# Patient Record
Sex: Female | Born: 1990 | Race: Black or African American | Hispanic: No | Marital: Single | Smoking: Never smoker
Health system: Southern US, Community
[De-identification: ages and names within clinical notes are randomized; demographics above are authoritative.]

---

## 2019-04-15 ENCOUNTER — Encounter: Payer: Self-pay | Admitting: *Deleted

## 2019-04-15 ENCOUNTER — Other Ambulatory Visit: Payer: Self-pay | Admitting: *Deleted

## 2019-04-15 DIAGNOSIS — Z20822 Contact with and (suspected) exposure to covid-19: Secondary | ICD-10-CM

## 2019-04-16 LAB — NOVEL CORONAVIRUS, NAA: SARS-CoV-2, NAA: NOT DETECTED

## 2020-05-08 ENCOUNTER — Other Ambulatory Visit: Payer: Self-pay

## 2020-05-08 ENCOUNTER — Emergency Department (HOSPITAL_COMMUNITY)
Admission: EM | Admit: 2020-05-08 | Discharge: 2020-05-08 | Disposition: A | Discharge status: Home / Self Care | Payer: Medicaid Other | Attending: Emergency Medicine | Admitting: Emergency Medicine

## 2020-05-08 ENCOUNTER — Encounter (HOSPITAL_COMMUNITY): Payer: Self-pay | Admitting: Emergency Medicine

## 2020-05-08 DIAGNOSIS — K047 Periapical abscess without sinus: Secondary | ICD-10-CM | POA: Diagnosis not present

## 2020-05-08 DIAGNOSIS — K0889 Other specified disorders of teeth and supporting structures: Secondary | ICD-10-CM | POA: Diagnosis present

## 2020-05-08 MED ORDER — PENICILLIN V POTASSIUM 500 MG PO TABS: 500.0000 mg | ORAL_TABLET | Freq: Four times a day (QID) | ORAL | 0 refills | Status: AC

## 2020-05-08 NOTE — ED Triage Notes (Signed)
Pt reports L upper dental abscess that popped this morning with drainage.  Denies pain.

## 2020-05-08 NOTE — ED Provider Notes (Signed)
[facility] EMERGENCY DEPARTMENT Provider Note   CSN: [id number] Arrival date & time: 05/08/20  1641     History Chief Complaint  Patient presents with  . dental abscess    Amber Sheppard is a 30 y.o. female presenting for evaluation of dental pain and swelling.   Patient states 2 years ago she cracked her left upper tooth.  She has had intermittent pain, usually controlled with Tylenol.  Last night she developed swelling in the area, she pressed on it, she had purulent drainage.  No drainage today.  No significant worsening pain.  Has not taken anything for pain.  No fevers or chills.  She does not currently have a dentist.  She has no other medical problems, takes no medications daily.  HPI     History reviewed. No pertinent past medical history.  There are no problems to display for this patient.   History reviewed. No pertinent surgical history.   OB History   No obstetric history on file.     No family history on file.  Social History   Tobacco Use  . Smoking status: Never Smoker  . Smokeless tobacco: Never Used  Substance Use Topics  . Alcohol use: Not Currently  . Drug use: Not Currently    Home Medications Prior to Admission medications   Medication Sig Start Date End Date Taking? Authorizing Provider  penicillin v potassium (VEETID) 500 MG tablet Take 1 tablet (500 mg total) by mouth 4 (four) times daily for 7 days. 05/08/20 05/15/20 Yes Milagros Loll, MD    Allergies    Patient has no allergy information on record.  Review of Systems   Review of Systems  Constitutional: Negative for fever.  HENT: Positive for dental problem.     Physical Exam Updated Vital Signs BP (!) 112/55 (BP Location: Right Arm)   Pulse 78   Temp 98.8 F (37.1 C) (Oral)   Resp 18   LMP 04/12/2020   SpO2 98%   Physical Exam Vitals and nursing note reviewed.  Constitutional:      General: She is not in acute distress.    Appearance: She is  well-developed and well-nourished.  HENT:     Head: Normocephalic and atraumatic.     Mouth/Throat:     Dentition: Abnormal dentition. Dental tenderness, gingival swelling and dental caries present.      Comments: Tooth cracked/ground down to gum. ttp of the gum. No purulent drainage. Surrounding gum erythema and edema.  Eyes:     Extraocular Movements: EOM normal.  Cardiovascular:     Rate and Rhythm: Normal rate and regular rhythm.     Pulses: Normal pulses.  Pulmonary:     Effort: Pulmonary effort is normal.     Breath sounds: Normal breath sounds.  Abdominal:     General: There is no distension.  Musculoskeletal:        General: Normal range of motion.     Cervical back: Normal range of motion.  Skin:    General: Skin is warm.     Findings: No rash.  Neurological:     Mental Status: She is alert and oriented to person, place, and time.  Psychiatric:        Mood and Affect: Mood and affect normal.     ED Results / Procedures / Treatments   Labs (all labs ordered are listed, but only abnormal results are displayed) Labs Reviewed - No data to display  EKG None  Radiology  No results found.  Procedures Procedures   Medications Ordered in ED Medications - No data to display  ED Course  I have reviewed the triage vital signs and the nursing notes.  Pertinent labs & imaging results that were available during my care of the patient were reviewed by me and considered in my medical decision making (see chart for details).    MDM Rules/Calculators/A&P                          Patient presenting for evaluation of dental pain and draining from the left upper tooth.  On exam, patient appears nontoxic.  She is without signs of systemic infection.  Exam shows a cracked tooth that is cracked down to the gum, tenderness to palpation and surrounding gum erythema/edema.  With reported purulent drainage, concern for infection.  Will treat with antibiotics.  Exam is not  consistent with deep space infection, I do not believe she needs emergent CT imaging or labs.  At this time, patient appears safe for discharge.  Return precautions given.  Patient states she understands and agrees to plan.  Final Clinical Impression(s) / ED Diagnoses Final diagnoses:  Dental abscess    Rx / DC Orders ED Discharge Orders         Ordered    penicillin v potassium (VEETID) 500 MG tablet  4 times daily        05/08/20 1908           Alveria Apley, PA-C 05/08/20 1920    Milagros Loll, MD 05/09/20 [id number]

## 2020-05-08 NOTE — ED Notes (Signed)
Discharge instructions discussed with pt. Pt verbalized understanding. Pt stable and ambulatory. No signature pad available. 

## 2020-05-08 NOTE — Discharge Instructions (Addendum)
Please take antibiotic.  Please follow-up with a dentist as soon as possible.  If you have fever, swelling, redness, return to ER for reassessment.  Take Tylenol Motrin for pain control.

## 2020-09-29 ENCOUNTER — Emergency Department (HOSPITAL_BASED_OUTPATIENT_CLINIC_OR_DEPARTMENT_OTHER)
Admission: EM | Admit: 2020-09-29 | Discharge: 2020-09-29 | Disposition: A | Discharge status: Home / Self Care | Payer: Medicaid Other | Attending: Emergency Medicine | Admitting: Emergency Medicine

## 2020-09-29 ENCOUNTER — Encounter (HOSPITAL_BASED_OUTPATIENT_CLINIC_OR_DEPARTMENT_OTHER): Payer: Self-pay

## 2020-09-29 ENCOUNTER — Emergency Department (HOSPITAL_BASED_OUTPATIENT_CLINIC_OR_DEPARTMENT_OTHER): Payer: Medicaid Other

## 2020-09-29 ENCOUNTER — Other Ambulatory Visit: Payer: Self-pay

## 2020-09-29 DIAGNOSIS — R1110 Vomiting, unspecified: Secondary | ICD-10-CM | POA: Insufficient documentation

## 2020-09-29 DIAGNOSIS — W228XXA Striking against or struck by other objects, initial encounter: Secondary | ICD-10-CM | POA: Diagnosis not present

## 2020-09-29 DIAGNOSIS — Y990 Civilian activity done for income or pay: Secondary | ICD-10-CM | POA: Diagnosis not present

## 2020-09-29 DIAGNOSIS — S0990XA Unspecified injury of head, initial encounter: Secondary | ICD-10-CM | POA: Insufficient documentation

## 2020-09-29 DIAGNOSIS — S060X0A Concussion without loss of consciousness, initial encounter: Secondary | ICD-10-CM

## 2020-09-29 NOTE — ED Triage Notes (Signed)
Pt reports heavy box striking her in the head while at work yesterday. 1 episode of vomiting. C/o lingering headache worse on right side.

## 2020-09-29 NOTE — ED Notes (Signed)
This RN presented the AVS utilizing Teachback Method. Patient verbalizes understanding of Discharge Instructions. Opportunity for Questioning and Answers were provided. Patient Discharged from ED ambulatory with Friend to Home.

## 2020-09-29 NOTE — ED Provider Notes (Signed)
[facility] EMERGENCY DEPT Provider Note   CSN: [id number] Arrival date & time: 09/29/20  1833     History Chief Complaint  Patient presents with   Head Injury    Amber Sheppard is a 30 y.o. female.  31 year old female who presents complaining of headache with emesis x1.  States that she had a block strike her head yesterday.  Denies any neck pain with this.  There was no LOC.  Since that time she is been light sensitive.  Has used over-the-counter medications without relief.  Denies any focal weakness      History reviewed. No pertinent past medical history.  There are no problems to display for this patient.   History reviewed. No pertinent surgical history.   OB History   No obstetric history on file.     No family history on file.  Social History   Tobacco Use   Smoking status: Never   Smokeless tobacco: Never  Substance Use Topics   Alcohol use: Not Currently   Drug use: Not Currently    Home Medications Prior to Admission medications   Not on File    Allergies    Patient has no known allergies.  Review of Systems   Review of Systems  All other systems reviewed and are negative.  Physical Exam Updated Vital Signs BP 136/84 (BP Location: Left Arm)   Pulse 66   Temp 98.2 F (36.8 C)   Resp 17   Ht 1.575 m (5\' 2" )   Wt 117.9 kg   SpO2 100%   BMI 47.55 kg/m   Physical Exam Vitals and nursing note reviewed.  Constitutional:      General: She is not in acute distress.    Appearance: Normal appearance. She is well-developed. She is not toxic-appearing.  HENT:     Head: Normocephalic and atraumatic.  Eyes:     General: Lids are normal.     Conjunctiva/sclera: Conjunctivae normal.     Pupils: Pupils are equal, round, and reactive to light.  Neck:     Thyroid: No thyroid mass.     Trachea: No tracheal deviation.  Cardiovascular:     Rate and Rhythm: Normal rate and regular rhythm.     Heart sounds: Normal heart sounds. No  murmur heard.   No gallop.  Pulmonary:     Effort: Pulmonary effort is normal. No respiratory distress.     Breath sounds: Normal breath sounds. No stridor. No decreased breath sounds, wheezing, rhonchi or rales.  Abdominal:     General: There is no distension.     Palpations: Abdomen is soft.     Tenderness: There is no abdominal tenderness. There is no rebound.  Musculoskeletal:        General: No tenderness. Normal range of motion.     Cervical back: Normal range of motion and neck supple.  Skin:    General: Skin is warm and dry.     Findings: No abrasion or rash.  Neurological:     General: No focal deficit present.     Mental Status: She is alert and oriented to person, place, and time. Mental status is at baseline.     GCS: GCS eye subscore is 4. GCS verbal subscore is 5. GCS motor subscore is 6.     Cranial Nerves: Cranial nerves are intact. No cranial nerve deficit.     Sensory: No sensory deficit.     Motor: Motor function is intact.     Coordination: Coordination  is intact.  Psychiatric:        Attention and Perception: Attention normal.        Speech: Speech normal.        Behavior: Behavior normal.    ED Results / Procedures / Treatments   Labs (all labs ordered are listed, but only abnormal results are displayed) Labs Reviewed - No data to display  EKG None  Radiology No results found.  Procedures Procedures   Medications Ordered in ED Medications - No data to display  ED Course  I have reviewed the triage vital signs and the nursing notes.  Pertinent labs & imaging results that were available during my care of the patient were reviewed by me and considered in my medical decision making (see chart for details).    MDM Rules/Calculators/A&P                          CT negative here.  Suspect patient has concussion.  Will give return precaution Final Clinical Impression(s) / ED Diagnoses Final diagnoses:  None    Rx / DC Orders ED Discharge  Orders     None        Lorre Nick, MD 09/29/20 2236

## 2020-09-29 NOTE — ED Notes (Signed)
Patient returned from CT

## 2020-10-25 ENCOUNTER — Emergency Department (HOSPITAL_BASED_OUTPATIENT_CLINIC_OR_DEPARTMENT_OTHER)
Admission: EM | Admit: 2020-10-25 | Discharge: 2020-10-25 | Disposition: A | Discharge status: Home / Self Care | Payer: Medicaid Other | Attending: Emergency Medicine | Admitting: Emergency Medicine

## 2020-10-25 ENCOUNTER — Other Ambulatory Visit: Payer: Self-pay

## 2020-10-25 DIAGNOSIS — K029 Dental caries, unspecified: Secondary | ICD-10-CM | POA: Insufficient documentation

## 2020-10-25 DIAGNOSIS — K0889 Other specified disorders of teeth and supporting structures: Secondary | ICD-10-CM | POA: Diagnosis present

## 2020-10-25 MED ORDER — PENICILLIN V POTASSIUM 500 MG PO TABS: 500.0000 mg | ORAL_TABLET | Freq: Four times a day (QID) | ORAL | 0 refills | Status: AC

## 2020-10-25 NOTE — ED Triage Notes (Signed)
Patient reports a tooth abscess that is draining down her throat

## 2020-10-25 NOTE — ED Notes (Signed)
This RN presented the AVS utilizing Teachback Method. Patient verbalizes understanding of Discharge Instructions. Opportunity for Questioning and Answers were provided. Patient Discharged from ED ambulatory to Home via SELF  

## 2020-10-25 NOTE — ED Provider Notes (Signed)
[facility] EMERGENCY DEPT Provider Note   CSN: [id number] Arrival date & time: 10/25/20  1725     History Chief Complaint  Patient presents with   Dental Pain    Amber Sheppard is a 30 y.o. female.  The history is provided by the patient.  Dental Pain Location:  Upper Upper teeth location:  13/LU 2nd bicuspid Quality:  Aching, localized and throbbing Severity:  Moderate Onset quality:  Gradual Duration:  2 days Timing:  Constant Progression:  Worsening Chronicity:  Recurrent Context: abscess and dental caries   Relieved by:  Acetaminophen Worsened by:  Jaw movement, pressure and touching Ineffective treatments:  None tried Associated symptoms: gum swelling   Associated symptoms: no difficulty swallowing, no facial pain, no facial swelling and no fever   Risk factors: periodontal disease   Risk factors comment:  Similar symptoms a few months ago and took penicillin and it resolved but does not have a dentist appointment till September.     No past medical history on file.  There are no problems to display for this patient.   No past surgical history on file.   OB History   No obstetric history on file.     No family history on file.  Social History   Tobacco Use   Smoking status: Never   Smokeless tobacco: Never  Substance Use Topics   Alcohol use: Not Currently   Drug use: Not Currently    Home Medications Prior to Admission medications   Medication Sig Start Date End Date Taking? Authorizing Provider  penicillin v potassium (VEETID) 500 MG tablet Take 1 tablet (500 mg total) by mouth 4 (four) times daily for 7 days. 10/25/20 11/01/20 Yes Gwyneth Sprout, MD    Allergies    Patient has no known allergies.  Review of Systems   Review of Systems  Constitutional:  Negative for fever.  HENT:  Negative for facial swelling.   All other systems reviewed and are negative.  Physical Exam Updated Vital Signs BP (!) 100/56 (BP Location:  Left Arm)   Pulse 80   Temp 98.8 F (37.1 C)   Resp 18   LMP 10/21/2020 (Approximate)   SpO2 99%   Physical Exam Vitals and nursing note reviewed.  Constitutional:      General: She is not in acute distress.    Appearance: Normal appearance.  HENT:     Head: Normocephalic.     Comments: No notable facial swelling    Nose: Nose normal.     Mouth/Throat:   Cardiovascular:     Rate and Rhythm: Normal rate.  Pulmonary:     Effort: Pulmonary effort is normal.  Lymphadenopathy:     Cervical: No cervical adenopathy.  Skin:    General: Skin is warm and dry.  Neurological:     Mental Status: She is alert and oriented to person, place, and time. Mental status is at baseline.  Psychiatric:        Mood and Affect: Mood normal.        Behavior: Behavior normal.    ED Results / Procedures / Treatments   Labs (all labs ordered are listed, but only abnormal results are displayed) Labs Reviewed - No data to display  EKG None  Radiology No results found.  Procedures Procedures   Medications Ordered in ED Medications - No data to display  ED Course  I have reviewed the triage vital signs and the nursing notes.  Pertinent labs & imaging results that  were available during my care of the patient were reviewed by me and considered in my medical decision making (see chart for details).    MDM Rules/Calculators/A&P                           Pt with dental caries and facial swelling.  No signs of ludwig's angina or difficulty swallowing and no systemic symptoms. Will treat with PCN and have pt f/u with dentist.  Final Clinical Impression(s) / ED Diagnoses Final diagnoses:  Dental caries    Rx / DC Orders ED Discharge Orders          Ordered    penicillin v potassium (VEETID) 500 MG tablet  4 times daily        10/25/20 1929             Gwyneth Sprout, MD 10/25/20 1932

## 2021-04-08 ENCOUNTER — Emergency Department (HOSPITAL_BASED_OUTPATIENT_CLINIC_OR_DEPARTMENT_OTHER)
Admission: EM | Admit: 2021-04-08 | Discharge: 2021-04-08 | Disposition: A | Discharge status: Home / Self Care | Payer: Medicaid Other | Attending: Emergency Medicine | Admitting: Emergency Medicine

## 2021-04-08 ENCOUNTER — Encounter (HOSPITAL_BASED_OUTPATIENT_CLINIC_OR_DEPARTMENT_OTHER): Payer: Self-pay | Admitting: Emergency Medicine

## 2021-04-08 ENCOUNTER — Other Ambulatory Visit: Payer: Self-pay

## 2021-04-08 DIAGNOSIS — J029 Acute pharyngitis, unspecified: Secondary | ICD-10-CM | POA: Insufficient documentation

## 2021-04-08 LAB — GROUP A STREP BY PCR: Group A Strep by PCR: NOT DETECTED

## 2021-04-08 MED ORDER — AMOXICILLIN-POT CLAVULANATE 875-125 MG PO TABS: 1.0000 | ORAL_TABLET | Freq: Two times a day (BID) | ORAL | 0 refills | Status: DC

## 2021-04-08 NOTE — Discharge Instructions (Addendum)
1.  Take Augmentin as prescribed. 2.  You may continue with salt water gargles, throat lozenges and ibuprofen for pain control. 3.  Return to emergency department if you develop any difficulty breathing, difficulty swallowing, high fevers neck stiffness or other concerning symptoms. 4.  See your family physician for recheck in 1 week for resolution of symptoms.

## 2021-04-08 NOTE — ED Triage Notes (Signed)
Pt stated that she has had a sore throat for the past couple of days. Pt stated that she was tested for the Flu and Covid and Monday and the results were negative.

## 2021-04-08 NOTE — ED Provider Notes (Signed)
[facility] EMERGENCY DEPT Provider Note   CSN: [id number] Arrival date & time: 04/08/21  3662     History  Chief Complaint  Patient presents with   Sore Throat    Amber Sheppard is a 31 y.o. female.  HPI Patient's had a sore throat for about 5 days.  She reports she had testing at [facility] medical clinic for influenza and COVID which were negative.  Patient where she has pain with swallowing.  She has gotten relief taking Aleve.  She reports she has had a slight earache but that resolved.  No other associated symptoms.  No sinus pressure or drainage, no cough, no fevers body aches or chills.  Patient reports she had strep throat last year.  She lives home with her children.  None of them are sick.  No known sick contacts.    Home Medications Prior to Admission medications   Medication Sig Start Date End Date Taking? Authorizing Provider  amoxicillin-clavulanate (AUGMENTIN) 875-125 MG tablet Take 1 tablet by mouth 2 (two) times daily. One po bid x 7 days 04/08/21  Yes Arby Barrette, MD      Allergies    Patient has no known allergies.    Review of Systems   Review of Systems 10 systems reviewed negative except as per HPI Physical Exam Updated Vital Signs BP 113/67 (BP Location: Right Arm)    Pulse 83    Temp 98.1 F (36.7 C) (Oral)    Resp 16    Ht 5\' 2"  (1.575 m)    Wt 113.4 kg    LMP 03/28/2021    SpO2 100%    BMI 45.73 kg/m  Physical Exam Constitutional:      Comments: Alert nontoxic well in appearance  HENT:     Right Ear: Tympanic membrane normal.     Left Ear: Tympanic membrane normal.     Mouth/Throat:     Comments: Tonsils mildly enlarged.  Trace exudates on both tonsils.  Moderate erythema of the tonsils and tonsillar pillars.  Airway is widely patent.  Voice is clear Eyes:     Extraocular Movements: Extraocular movements intact.     Conjunctiva/sclera: Conjunctivae normal.  Cardiovascular:     Rate and Rhythm: Normal rate and regular rhythm.   Pulmonary:     Effort: Pulmonary effort is normal.     Breath sounds: Normal breath sounds.  Abdominal:     General: There is no distension.     Palpations: Abdomen is soft.  Musculoskeletal:        General: No swelling.     Cervical back: Neck supple.     Right lower leg: No edema.     Left lower leg: No edema.  Skin:    General: Skin is warm and dry.  Neurological:     General: No focal deficit present.     Mental Status: She is oriented to person, place, and time.     Coordination: Coordination normal.  Psychiatric:        Mood and Affect: Mood normal.    ED Results / Procedures / Treatments   Labs (all labs ordered are listed, but only abnormal results are displayed) Labs Reviewed  GROUP A STREP BY PCR    EKG None  Radiology No results found.  Procedures Procedures    Medications Ordered in ED Medications - No data to display  ED Course/ Medical Decision Making/ A&P  Medical Decision Making  Patient presents with 5 days of worsening sore throat.  She does have exudative pharyngitis.  Strep testing is negative.  Patient previously had negative COVID and influenza testing.  Patient does not have other associated constitutional symptoms. With positive exudative pharyngitis and no constitutional symptoms, will opt to treat for suspected bacterial pharyngitis.  Augmentin prescribed.  Patient is otherwise clinically stable with stable airway and appropriate for ongoing outpatient management. Careful return precautions included in discharge instructions. Final Clinical Impression(s) / ED Diagnoses Final diagnoses:  Exudative pharyngitis    Rx / DC Orders ED Discharge Orders          Ordered    amoxicillin-clavulanate (AUGMENTIN) 875-125 MG tablet  2 times daily        04/08/21 1037              Arby Barrette, MD 04/08/21 1039

## 2021-04-08 NOTE — ED Notes (Signed)
Pt added that she is currently on Flagyl for vaginosis.

## 2022-02-06 ENCOUNTER — Encounter (INDEPENDENT_AMBULATORY_CARE_PROVIDER_SITE_OTHER): Payer: Self-pay

## 2022-03-01 ENCOUNTER — Ambulatory Visit (INDEPENDENT_AMBULATORY_CARE_PROVIDER_SITE_OTHER): Payer: Medicaid Other | Admitting: Internal Medicine

## 2022-03-01 ENCOUNTER — Encounter (INDEPENDENT_AMBULATORY_CARE_PROVIDER_SITE_OTHER): Payer: Self-pay | Admitting: Internal Medicine

## 2022-03-01 VITALS — BP 119/79 | HR 78 | Temp 98.4°F | Ht 62.0 in | Wt 276.0 lb

## 2022-03-01 DIAGNOSIS — E669 Obesity, unspecified: Secondary | ICD-10-CM

## 2022-03-01 DIAGNOSIS — Z8632 Personal history of gestational diabetes: Secondary | ICD-10-CM | POA: Diagnosis not present

## 2022-03-01 DIAGNOSIS — E559 Vitamin D deficiency, unspecified: Secondary | ICD-10-CM | POA: Diagnosis not present

## 2022-03-01 DIAGNOSIS — Z6841 Body Mass Index (BMI) 40.0 and over, adult: Secondary | ICD-10-CM | POA: Diagnosis not present

## 2022-03-01 NOTE — Progress Notes (Signed)
Office: [phone]  /  Fax: [phone]   Initial Visit  Amber Sheppard was seen in clinic today to evaluate for obesity. She is interested in losing weight to improve overall health and reduce the risk of weight related complications. She presents today to review program treatment options, initial physical assessment, and evaluation.   She is married has 3 kids and works from home.  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  She was referred by: PCP  When asked what else they would like to accomplish? She states: Adopt healthier eating patterns, Improve quality of life, Improve appearance, and Improve self-confidence  When asked how has your weight affected you? She states: Contributed to medical problems, Having fatigue, Having poor endurance, and Problems with eating patterns  Some associated conditions: Prediabetes and Other: Vitamin D deficiency  Contributing factors: Family history, Disruption of circadian rhythm, Stress, Pregnancy, and Other: PCOS.  She also tends to skip meals  Weight promoting medications identified: None  Current nutrition plan: None  Current level of physical activity: NEAT  Current or previous pharmacotherapy: Phentermine  Response to medication: Lost weight initially but was unable to sustain weight loss   Past medical history includes:  History reviewed. No pertinent past medical history.   Objective:   BP 119/79   Pulse 78   Temp 98.4 F (36.9 C)   Ht 5\' 2"  (1.575 m)   Wt 276 lb (125.2 kg)   SpO2 99%   BMI 50.48 kg/m  She was weighed on the bioimpedance scale: Body mass index is 50.48 kg/m.  Peak Weight: 278,Visceral Fat Rating: 16, Body Fat%: 50%, Weight trend over the last 12 months: Increasing  General:  Alert, oriented and cooperative. Patient is in no acute distress.  Respiratory: Normal respiratory effort, no problems with respiration noted  Extremities: Normal range of motion.    Mental Status: Normal mood and affect. Normal behavior.  Normal judgment and thought content.   Assessment and Plan:  1. 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 She would benefit from weight loss therapy via a modified calorie, low-carb, high-protein nutritional plan tailored to their REE (resting energy expenditure) which will be determined by indirect calorimetry.  We will also assess for cardiometabolic risk and nutritional derangements via fasting serologies at her next appointment.   2. Vitamin D deficiency I reviewed labs including Care Everywhere we can access her labs she will bring a copy of most recent blood work.  She is currently on high-dose vitamin D.  This is associated with adiposity and may result in leptin resistance, weight gain and fatigue.  We will check levels if not done recently to make sure adequately replenished  3. Hx gestational diabetes She had gestational diabetes with her 3 pregnancies.  She is at risk for developing diabetes.  We will check fasting blood sugar and hemoglobin A1c with intake labs.       Obesity Treatment / Action Plan:  Patient will work on garnering support from family and friends to begin weight loss journey. Will work on eliminating or reducing the presence of highly palatable, calorie dense foods in the home. Will complete provided nutritional and psychosocial assessment questionnaire before the next appointment. Will be scheduled for indirect calorimetry to determine resting energy expenditure in a fasting state.  This will allow [place] to create a reduced calorie, high-protein  meal plan to promote loss of fat mass while preserving muscle mass. Will think about ideas on how to incorporate physical activity into their daily routine. Counseled on the health benefits of losing 5%-15% of total body weight. Was  counseled on nutritional approaches to weight loss and benefits of complex carbs and high quality protein as part of nutritional weight management. Was counseled on pharmacotherapy and role as an adjunct in weight management.   Obesity Education Performed Today:  She was weighed on the bioimpedance scale and results were discussed and documented in the synopsis.  We discussed obesity as a disease and the importance of a more detailed evaluation of all the factors contributing to the disease.  We discussed the importance of long term lifestyle changes which include nutrition, exercise and behavioral modifications as well as the importance of customizing this to her specific health and social needs.  We discussed the benefits of reaching a healthier weight to alleviate the symptoms of existing conditions and reduce the risks of the biomechanical, metabolic and psychological effects of obesity.  Virgina Norfolk appears to be in the action stage of change and states they are ready to start intensive lifestyle modifications and behavioral modifications.  30 minutes was spent today on this visit including the above counseling, pre-visit chart review, and post-visit documentation.  Reviewed by clinician on day of visit: allergies, medications, problem list, medical history, surgical history, family history, social history, and previous encounter notes.    I have reviewed the above documentation for accuracy and completeness, and I agree with the above.  Worthy Rancher, MD

## 2022-07-20 ENCOUNTER — Ambulatory Visit
Admission: RE | Admit: 2022-07-20 | Discharge: 2022-07-20 | Disposition: A | Discharge status: Home / Self Care | Payer: Medicaid Other | Source: Ambulatory Visit | Attending: Nurse Practitioner | Admitting: Nurse Practitioner

## 2022-07-20 VITALS — BP 130/77 | HR 75 | Temp 98.3°F | Resp 18

## 2022-07-20 DIAGNOSIS — Z113 Encounter for screening for infections with a predominantly sexual mode of transmission: Secondary | ICD-10-CM | POA: Insufficient documentation

## 2022-07-20 DIAGNOSIS — N3000 Acute cystitis without hematuria: Secondary | ICD-10-CM | POA: Diagnosis present

## 2022-07-20 LAB — POCT URINALYSIS DIP (MANUAL ENTRY)
Bilirubin, UA: NEGATIVE
Blood, UA: NEGATIVE
Glucose, UA: NEGATIVE mg/dL
Ketones, POC UA: NEGATIVE mg/dL
Nitrite, UA: NEGATIVE
Protein Ur, POC: NEGATIVE mg/dL
Spec Grav, UA: >=1.03 — AB (ref 1.010–1.025)
Urobilinogen, UA: 0.2 E.U./dL
pH, UA: 7 (ref 5.0–8.0)

## 2022-07-20 LAB — POCT URINE PREGNANCY: Preg Test, Ur: NEGATIVE

## 2022-07-20 MED ORDER — CEPHALEXIN 500 MG PO CAPS
500.0000 mg | ORAL_CAPSULE | Freq: Two times a day (BID) | ORAL | 0 refills | Status: AC
Start: 2022-07-20 — End: 2022-07-27

## 2022-07-20 NOTE — ED Provider Notes (Signed)
[facility]    CSN: [id number] Arrival date & time: 07/20/22  1300      History   Chief Complaint Chief Complaint  Patient presents with   Exposure to STD    HPI Amber Sheppard is a 32 y.o. female presents for evaluation of dysuria and STD testing.  Patient reports she has had some vaginal discharge with some mild dysuria over the past couple of days.  Denies any urinary urgency or frequency, hematuria, fevers, nausea/vomiting, flank pain.  She did recently find out that her fianc has had an affair and she would like STD testing.  She denies any known exposure to any STDs.  She has not taken any OTC medications for her urinary symptoms or vaginal discharge.  She denies any other concerns at this time.   Exposure to STD    History reviewed. No pertinent past medical history.  There are no problems to display for this patient.   History reviewed. No pertinent surgical history.  OB History   No obstetric history on file.      Home Medications    Prior to Admission medications   Medication Sig Start Date End Date Taking? Authorizing Provider  cephALEXin (KEFLEX) 500 MG capsule Take 1 capsule (500 mg total) by mouth 2 (two) times daily for 7 days. 07/20/22 07/27/22 Yes Radford Pax, NP    Family History History reviewed. No pertinent family history.  Social History Social History   Tobacco Use   Smoking status: Never   Smokeless tobacco: Never  Substance Use Topics   Alcohol use: Not Currently   Drug use: Not Currently     Allergies   Patient has no known allergies.   Review of Systems Review of Systems  Genitourinary:  Positive for dysuria and vaginal discharge.     Physical Exam Triage Vital Signs ED Triage Vitals [07/20/22 1316]  Enc Vitals Group     BP 130/77     Pulse Rate 75     Resp 18     Temp 98.3 F (36.8 C)     Temp Source Oral     SpO2 96 %     Weight      Height      Head Circumference      Peak Flow      Pain Score       Pain Loc      Pain Edu?      Excl. in GC?    No data found.  Updated Vital Signs BP 130/77 (BP Location: Right Arm)   Pulse 75   Temp 98.3 F (36.8 C) (Oral)   Resp 18   LMP 07/11/2022 (Exact Date)   SpO2 96%   Visual Acuity Right Eye Distance:   Left Eye Distance:   Bilateral Distance:    Right Eye Near:   Left Eye Near:    Bilateral Near:     Physical Exam Vitals and nursing note reviewed.  Constitutional:      Appearance: Normal appearance.  HENT:     Head: Normocephalic and atraumatic.  Eyes:     Pupils: Pupils are equal, round, and reactive to light.  Cardiovascular:     Rate and Rhythm: Normal rate.  Pulmonary:     Effort: Pulmonary effort is normal.  Abdominal:     Tenderness: There is no right CVA tenderness or left CVA tenderness.  Skin:    General: Skin is warm and dry.  Neurological:  General: No focal deficit present.     Mental Status: She is alert and oriented to person, place, and time.  Psychiatric:        Mood and Affect: Mood normal.        Behavior: Behavior normal.      UC Treatments / Results  Labs (all labs ordered are listed, but only abnormal results are displayed) Labs Reviewed  POCT URINALYSIS DIP (MANUAL ENTRY) - Abnormal; Notable for the following components:      Result Value   Spec Grav, UA >=1.030 (*)    Leukocytes, UA Trace (*)    All other components within normal limits  URINE CULTURE  RPR  HIV ANTIBODY (ROUTINE TESTING W REFLEX)  POCT URINE PREGNANCY  CERVICOVAGINAL ANCILLARY ONLY    EKG   Radiology No results found.  Procedures Procedures (including critical care time)  Medications Ordered in UC Medications - No data to display  Initial Impression / Assessment and Plan / UC Course  I have reviewed the triage vital signs and the nursing notes.  Pertinent labs & imaging results that were available during my care of the patient were reviewed by me and considered in my medical decision making (see  chart for details).     UA does show leukocytes, will culture and start Keflex 500 mg twice daily for 7 days based on symptoms STD testing as ordered and will contact for any positive results Follow-up with PCP as needed ER precautions reviewed and patient verbalized understanding Final Clinical Impressions(s) / UC Diagnoses   Final diagnoses:  Acute cystitis without hematuria  Screening examination for STD (sexually transmitted disease)     Discharge Instructions      I will send your urine urine for culture and contact you if anything is positive we will also send off your STD testing and let you know if any of that is positive as well.  Please start Keflex twice daily for 7 days for your urinary symptoms.  Increase fluids as well Follow-up with your PCP if your symptoms do not improve Please go to the ER for any worsening symptoms   ED Prescriptions     Medication Sig Dispense Auth. Provider   cephALEXin (KEFLEX) 500 MG capsule Take 1 capsule (500 mg total) by mouth 2 (two) times daily for 7 days. 14 capsule Radford Pax, NP      PDMP not reviewed this encounter.   Radford Pax, NP 07/20/22 1351

## 2022-07-20 NOTE — ED Triage Notes (Addendum)
Pt reports she recently found out her partner has been having an affair. Pt has concern for STD exposure. States she has had some discharge. Endorses mild dysuria.

## 2022-07-20 NOTE — Discharge Instructions (Signed)
I will send your urine urine for culture and contact you if anything is positive we will also send off your STD testing and let you know if any of that is positive as well.  Please start Keflex twice daily for 7 days for your urinary symptoms.  Increase fluids as well Follow-up with your PCP if your symptoms do not improve Please go to the ER for any worsening symptoms

## 2022-07-21 LAB — URINE CULTURE: Culture: <10000 — AB

## 2022-07-21 LAB — HIV ANTIBODY (ROUTINE TESTING W REFLEX): HIV Screen 4th Generation wRfx: NONREACTIVE

## 2022-07-21 LAB — CERVICOVAGINAL ANCILLARY ONLY
Bacterial Vaginitis (gardnerella): POSITIVE — AB
Candida Glabrata: NEGATIVE
Candida Vaginitis: NEGATIVE
Chlamydia: NEGATIVE
Comment: NEGATIVE
Comment: NEGATIVE
Comment: NEGATIVE
Comment: NEGATIVE
Comment: NEGATIVE
Comment: NORMAL
Neisseria Gonorrhea: NEGATIVE
Trichomonas: NEGATIVE

## 2022-07-21 LAB — RPR: RPR Ser Ql: NONREACTIVE

## 2022-07-24 ENCOUNTER — Telehealth (HOSPITAL_COMMUNITY): Payer: Self-pay | Admitting: Emergency Medicine

## 2022-07-24 MED ORDER — METRONIDAZOLE 500 MG PO TABS: 500.0000 mg | ORAL_TABLET | Freq: Two times a day (BID) | ORAL | 0 refills | Status: DC

## 2022-07-25 ENCOUNTER — Ambulatory Visit
Admission: EM | Admit: 2022-07-25 | Discharge: 2022-07-25 | Disposition: A | Discharge status: Home / Self Care | Payer: Medicaid Other | Attending: Physician Assistant | Admitting: Physician Assistant

## 2022-07-25 ENCOUNTER — Other Ambulatory Visit: Payer: Self-pay

## 2022-07-25 ENCOUNTER — Encounter: Payer: Self-pay | Admitting: Emergency Medicine

## 2022-07-25 DIAGNOSIS — M791 Myalgia, unspecified site: Secondary | ICD-10-CM

## 2022-07-25 MED ORDER — DICLOFENAC SODIUM 75 MG PO TBEC: 75.0000 mg | DELAYED_RELEASE_TABLET | Freq: Two times a day (BID) | ORAL | 0 refills | Status: DC

## 2022-07-25 MED ORDER — METHOCARBAMOL 500 MG PO TABS: 500.0000 mg | ORAL_TABLET | Freq: Four times a day (QID) | ORAL | 0 refills | Status: DC
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
## 2022-09-01 ENCOUNTER — Other Ambulatory Visit: Payer: Self-pay

## 2022-09-01 ENCOUNTER — Encounter (HOSPITAL_BASED_OUTPATIENT_CLINIC_OR_DEPARTMENT_OTHER): Payer: Self-pay | Admitting: Emergency Medicine

## 2022-09-01 ENCOUNTER — Emergency Department (HOSPITAL_COMMUNITY)
Admission: EM | Admit: 2022-09-01 | Discharge: 2022-09-01 | Discharge status: Against Medical Advice | Payer: Medicaid Other | Attending: Emergency Medicine | Admitting: Emergency Medicine

## 2022-09-01 ENCOUNTER — Emergency Department (HOSPITAL_BASED_OUTPATIENT_CLINIC_OR_DEPARTMENT_OTHER)
Admission: EM | Admit: 2022-09-01 | Discharge: 2022-09-01 | Disposition: A | Discharge status: Home / Self Care | Payer: Medicaid Other | Source: Home / Self Care | Attending: Emergency Medicine | Admitting: Emergency Medicine

## 2022-09-01 DIAGNOSIS — R1110 Vomiting, unspecified: Secondary | ICD-10-CM | POA: Insufficient documentation

## 2022-09-01 DIAGNOSIS — K0889 Other specified disorders of teeth and supporting structures: Secondary | ICD-10-CM

## 2022-09-01 DIAGNOSIS — Z5321 Procedure and treatment not carried out due to patient leaving prior to being seen by health care provider: Secondary | ICD-10-CM | POA: Insufficient documentation

## 2022-09-01 MED ORDER — CLINDAMYCIN HCL 150 MG PO CAPS: 150.0000 mg | ORAL_CAPSULE | Freq: Four times a day (QID) | ORAL | 0 refills | Status: DC

## 2022-09-01 MED ORDER — HYDROCODONE-ACETAMINOPHEN 5-325 MG PO TABS
2.0000 | ORAL_TABLET | Freq: Once | ORAL | Status: AC
  Administered 2022-09-01: 2 via ORAL
  Filled 2022-09-01: qty 2

## 2022-09-01 MED ORDER — CLINDAMYCIN HCL 150 MG PO CAPS
300.0000 mg | ORAL_CAPSULE | Freq: Once | ORAL | Status: AC
  Administered 2022-09-01: 300 mg via ORAL
  Filled 2022-09-01: qty 2

## 2022-09-01 MED ORDER — HYDROCODONE-ACETAMINOPHEN 5-325 MG PO TABS: 1.0000 | ORAL_TABLET | Freq: Four times a day (QID) | ORAL | 0 refills | Status: DC | PRN

## 2022-09-01 NOTE — ED Provider Notes (Signed)
  [place] EMERGENCY DEPARTMENT AT [facility] Provider Note   CSN: [id number] Arrival date & time: 09/01/22  0865     History  Chief Complaint  Patient presents with   Dental Pain    Amber Sheppard is a 32 y.o. female.  Patient is a 32 year old female presenting with complaints of dental pain.  She had 3 lower molars extracted 2 days ago.  She is now having worsening pain that is unrelieved with Tylenol/ibuprofen.  No fevers or chills.  No difficulty breathing or swallowing.  The history is provided by the patient.       Home Medications Prior to Admission medications   Medication Sig Start Date End Date Taking? Authorizing Provider  diclofenac (VOLTAREN) 75 MG EC tablet Take 1 tablet (75 mg total) by mouth 2 (two) times daily. 07/25/22   Elson Areas, PA-C  methocarbamol (ROBAXIN) 500 MG tablet Take 1 tablet (500 mg total) by mouth 4 (four) times daily. 07/25/22   Elson Areas, PA-C  metroNIDAZOLE (FLAGYL) 500 MG tablet Take 1 tablet (500 mg total) by mouth 2 (two) times daily. Patient not taking: Reported on 07/25/2022 07/24/22   Merrilee Jansky, MD      Allergies    Patient has no known allergies.    Review of Systems   Review of Systems  All other systems reviewed and are negative.   Physical Exam Updated Vital Signs BP 133/85   Pulse 71   Temp 98.6 F (37 C)   Resp 18   SpO2 96%  Physical Exam Vitals and nursing note reviewed.  Constitutional:      Appearance: Normal appearance.  HENT:     Head: Normocephalic.     Mouth/Throat:     Comments: There are extraction sites of the left lower first and second molar and right lower molar.  There is some surrounding gingival inflammation, but no purulent drainage. Pulmonary:     Effort: Pulmonary effort is normal.  Skin:    General: Skin is warm and dry.  Neurological:     Mental Status: She is alert and oriented to person, place, and time.     ED Results / Procedures / Treatments    Labs (all labs ordered are listed, but only abnormal results are displayed) Labs Reviewed - No data to display  EKG None  Radiology No results found.  Procedures Procedures    Medications Ordered in ED Medications  HYDROcodone-acetaminophen (NORCO/VICODIN) 5-325 MG per tablet 2 tablet (has no administration in time range)  clindamycin (CLEOCIN) capsule 300 mg (has no administration in time range)    ED Course/ Medical Decision Making/ A&P  Patient presenting with dental pain 2 days status post dental extraction.  Patient will be given antibiotics and medication for pain.  To follow-up with her dentist in the next few days.  Final Clinical Impression(s) / ED Diagnoses Final diagnoses:  None    Rx / DC Orders ED Discharge Orders     None         Geoffery Lyons, MD 09/01/22 [phone]

## 2022-09-01 NOTE — ED Triage Notes (Signed)
Presents with dental pain on L&R side, that radiates to ear. Pt reports 3 extractions 3 days ago. Pt reports swelling bilateral, denies difficulty swallowing/breathing.  Reports 1 episode of vomiting 1 hr ago.

## 2022-09-01 NOTE — ED Notes (Signed)
Pt stated that she was leaving  

## 2022-09-01 NOTE — ED Triage Notes (Signed)
Pt c/o dental pain after having 3 molars removed x 3 days ago. Pt states that she has been taking tylenol and motrin without relief.

## 2022-09-01 NOTE — Discharge Instructions (Addendum)
Begin taking clindamycin as prescribed.  Take ibuprofen 600 mg every 6 hours as needed for pain.  Begin taking hydrocodone as prescribed as needed for pain not relieved with ibuprofen.

## 2024-01-14 ENCOUNTER — Emergency Department (HOSPITAL_BASED_OUTPATIENT_CLINIC_OR_DEPARTMENT_OTHER)
Admission: EM | Admit: 2024-01-14 | Discharge: 2024-01-15 | Disposition: A | Attending: Emergency Medicine | Admitting: Emergency Medicine

## 2024-01-14 ENCOUNTER — Other Ambulatory Visit: Payer: Self-pay

## 2024-01-14 DIAGNOSIS — K122 Cellulitis and abscess of mouth: Secondary | ICD-10-CM | POA: Diagnosis not present

## 2024-01-14 DIAGNOSIS — J029 Acute pharyngitis, unspecified: Secondary | ICD-10-CM | POA: Insufficient documentation

## 2024-01-14 NOTE — ED Triage Notes (Addendum)
 Sore throat 2 days-feels like glass. Difficulty visualizing tonsils. Congested. No cough. Left ear fullness.

## 2024-01-15 LAB — MONONUCLEOSIS SCREEN: Mono Screen: NEGATIVE

## 2024-01-15 LAB — RESP PANEL BY RT-PCR (RSV, FLU A&B, COVID)  RVPGX2
Influenza A by PCR: NEGATIVE
Influenza B by PCR: NEGATIVE
Resp Syncytial Virus by PCR: NEGATIVE
SARS Coronavirus 2 by RT PCR: NEGATIVE

## 2024-01-15 LAB — GROUP A STREP BY PCR: Group A Strep by PCR: NOT DETECTED

## 2024-01-15 MED ORDER — LIDOCAINE VISCOUS HCL 2 % MT SOLN
15.0000 mL | Freq: Once | OROMUCOSAL | Status: AC
  Administered 2024-01-15: 15 mL via OROMUCOSAL
  Filled 2024-01-15: qty 15

## 2024-01-15 MED ORDER — DEXAMETHASONE 4 MG PO TABS
10.0000 mg | ORAL_TABLET | Freq: Once | ORAL | Status: AC
  Administered 2024-01-15: 10 mg via ORAL
  Filled 2024-01-15: qty 3

## 2024-01-15 MED ORDER — LIDOCAINE VISCOUS HCL 2 % MT SOLN: 15.0000 mL | Freq: Four times a day (QID) | OROMUCOSAL | 0 refills | Status: AC | PRN

## 2024-01-15 NOTE — ED Provider Notes (Signed)
 [place] EMERGENCY DEPARTMENT AT [facility] Provider Note   CSN: [id number] Arrival date & time: 01/14/24  2031     Patient presents with: Sore Throat   Amber Sheppard is a 33 y.o. female.   presents with difficulty swallowing that began on Sunday and worsened by Monday morning. The patient reports an inability to swallow even water without pain. There are no associated fevers, but the patient experienced chills and body aches, prompting the visit. The patient denies coughing and has no known sick contacts. The patient is sexually active and reports engaging in oral intercourse but is unaware of any sexually transmitted diseases in partners. The patient has no history of mononucleosis and denies any allergies. History was obtained from the patient.   Sore Throat       Prior to Admission medications   Medication Sig Start Date End Date Taking? Authorizing Provider  lidocaine (XYLOCAINE) 2 % solution Use as directed 15 mLs in the mouth or throat 4 (four) times daily as needed for mouth pain. 01/15/24  Yes Yaquelin Langelier, Selinda, MD  clindamycin  (CLEOCIN ) 150 MG capsule Take 1 capsule (150 mg total) by mouth every 6 (six) hours. 09/01/22   Geroldine Berg, MD  diclofenac  (VOLTAREN ) 75 MG EC tablet Take 1 tablet (75 mg total) by mouth 2 (two) times daily. 07/25/22   Sofia, Leslie K, PA-C  HYDROcodone -acetaminophen  (NORCO) 5-325 MG tablet Take 1-2 tablets by mouth every 6 (six) hours as needed. 09/01/22   Geroldine Berg, MD  methocarbamol  (ROBAXIN ) 500 MG tablet Take 1 tablet (500 mg total) by mouth 4 (four) times daily. 07/25/22   Sofia, Leslie K, PA-C  metroNIDAZOLE  (FLAGYL ) 500 MG tablet Take 1 tablet (500 mg total) by mouth 2 (two) times daily. Patient not taking: Reported on 07/25/2022 07/24/22   Blaise Aleene KIDD, MD    Allergies: Patient has no known allergies.    Review of Systems  Updated Vital Signs BP (!) 132/58   Pulse 91   Temp 98.7 F (37.1 C) (Oral)   Resp 17   SpO2 99%    Physical Exam Vitals and nursing note reviewed.  Constitutional:      Appearance: She is well-developed.  HENT:     Head: Normocephalic and atraumatic.     Comments: Mild uvular edema Mild erythema posterior oropharynx A couple small white plaques on posterior tonsils Cardiovascular:     Rate and Rhythm: Normal rate and regular rhythm.  Pulmonary:     Effort: No respiratory distress.     Breath sounds: No stridor.  Abdominal:     General: There is no distension.  Musculoskeletal:     Cervical back: Normal range of motion.  Neurological:     Mental Status: She is alert.     (all labs ordered are listed, but only abnormal results are displayed) Labs Reviewed  GROUP A STREP BY PCR  RESP PANEL BY RT-PCR (RSV, FLU A&B, COVID)  [id number]  MONONUCLEOSIS SCREEN  GC/CHLAMYDIA PROBE AMP ([facility]) NOT AT [facility]    EKG: None  Radiology: No results found.   Procedures   Medications Ordered in the ED  lidocaine (XYLOCAINE) 2 % viscous mouth solution 15 mL (15 mLs Mouth/Throat Given 01/15/24 0043)  dexamethasone (DECADRON) tablet 10 mg (10 mg Oral Given 01/15/24 0042)  Medical Decision Making Amount and/or Complexity of Data Reviewed Labs: ordered.  Risk Prescription drug management.   The patient presented with difficulty swallowing, reporting that even swallowing air or water was painful. The patient denied fever but experienced chills and body aches. A strep test was negative. On examination, the throat was swollen with a few white spots, but no significant pus was observed. The patient was administered a dose of steroids to reduce inflammation and viscous lidocaine for numbing. The patient was informed that the lidocaine might cause nausea, and a prescription would be provided if it was well-tolerated. Tests for gonorrhea and mononucleosis were ordered, with results pending. The patient was advised that if gonorrhea was confirmed, they  would be contacted with a prescription.  Differential Diagnosis: Differential diagnosis includes but is not limited to: viral pharyngitis, bacterial pharyngitis, mononucleosis, gonococcal pharyngitis, and allergic reaction.  Strep/covid/mon negative. Lidocaine helped. Protecting airway. No stridor. Stable for d/c w/ pcp follow up.    Final diagnoses:  Uvulitis  Pharyngitis, unspecified etiology    ED Discharge Orders          Ordered    lidocaine (XYLOCAINE) 2 % solution  4 times daily PRN        01/15/24 0149               Truitt Cruey, Selinda, MD 01/15/24 [id number]

## 2024-01-15 NOTE — ED Notes (Signed)
 ED Provider at bedside.

## 2024-01-16 LAB — GC/CHLAMYDIA PROBE AMP (~~LOC~~) NOT AT ARMC
Chlamydia: NEGATIVE
Comment: NEGATIVE
Comment: NORMAL
Neisseria Gonorrhea: POSITIVE — AB

## 2024-01-18 ENCOUNTER — Ambulatory Visit (HOSPITAL_COMMUNITY): Payer: Self-pay

## 2024-01-19 ENCOUNTER — Encounter (HOSPITAL_COMMUNITY): Payer: Self-pay | Admitting: Emergency Medicine

## 2024-01-19 ENCOUNTER — Ambulatory Visit (HOSPITAL_COMMUNITY)
Admission: EM | Admit: 2024-01-19 | Discharge: 2024-01-19 | Disposition: A | Discharge status: Home / Self Care | Attending: Emergency Medicine | Admitting: Emergency Medicine

## 2024-01-19 DIAGNOSIS — A545 Gonococcal pharyngitis: Secondary | ICD-10-CM

## 2024-01-19 DIAGNOSIS — Z3202 Encounter for pregnancy test, result negative: Secondary | ICD-10-CM

## 2024-01-19 LAB — POCT URINE PREGNANCY: Preg Test, Ur: NEGATIVE

## 2024-01-19 MED ORDER — CEFTRIAXONE SODIUM 500 MG IJ SOLR
500.0000 mg | INTRAMUSCULAR | Status: DC
  Administered 2024-01-19: 500 mg via INTRAMUSCULAR

## 2024-01-19 MED ORDER — LIDOCAINE HCL (PF) 1 % IJ SOLN
INTRAMUSCULAR | Status: AC
  Filled 2024-01-19: qty 2

## 2024-01-19 MED ORDER — CEFTRIAXONE SODIUM 500 MG IJ SOLR
INTRAMUSCULAR | Status: AC
  Filled 2024-01-19: qty 500

## 2024-01-19 NOTE — Discharge Instructions (Signed)
 You received an injection of Rocephin in clinic today to treat gonorrhea. It is recommended to refrain from sexual intercourse for at least 7 days after receiving this treatment. Follow-up with your primary care provider or return here as needed.

## 2024-01-19 NOTE — ED Provider Notes (Signed)
 [facility]    CSN: [id number] Arrival date & time: 01/19/24  1128      History   Chief Complaint No chief complaint on file.   HPI Amber Sheppard is a 33 y.o. female.   Patient presents for treatment of gonorrhea.  Patient states that she was seen in the emergency department on 10/21 for sore throat and difficulty swallowing and tested positive for gonorrhea during this visit.  LMP 9/4.  Patient states that she has a history of irregular menstrual cycles.  Denies abnormal vaginal discharge, vaginal pain, vaginal itching or irritation, dysuria, hematuria, urinary frequency/urgency, abdominal pain, and flank pain.  The history is provided by the patient and medical records.    History reviewed. No pertinent past medical history.  There are no active problems to display for this patient.   History reviewed. No pertinent surgical history.  OB History   No obstetric history on file.      Home Medications    Prior to Admission medications   Medication Sig Start Date End Date Taking? Authorizing Provider  clindamycin  (CLEOCIN ) 150 MG capsule Take 1 capsule (150 mg total) by mouth every 6 (six) hours. 09/01/22   Geroldine Berg, MD  diclofenac  (VOLTAREN ) 75 MG EC tablet Take 1 tablet (75 mg total) by mouth 2 (two) times daily. 07/25/22   Sofia, Leslie K, PA-C  HYDROcodone -acetaminophen  (NORCO) 5-325 MG tablet Take 1-2 tablets by mouth every 6 (six) hours as needed. 09/01/22   Geroldine Berg, MD  lidocaine (XYLOCAINE) 2 % solution Use as directed 15 mLs in the mouth or throat 4 (four) times daily as needed for mouth pain. 01/15/24   Mesner, Selinda, MD  methocarbamol  (ROBAXIN ) 500 MG tablet Take 1 tablet (500 mg total) by mouth 4 (four) times daily. 07/25/22   Sofia, Leslie K, PA-C  metroNIDAZOLE  (FLAGYL ) 500 MG tablet Take 1 tablet (500 mg total) by mouth 2 (two) times daily. Patient not taking: Reported on 07/25/2022 07/24/22   Blaise Aleene KIDD, MD    Family History No  family history on file.  Social History Social History   Tobacco Use   Smoking status: Never   Smokeless tobacco: Never  Vaping Use   Vaping status: Never Used  Substance Use Topics   Alcohol use: Not Currently   Drug use: Not Currently     Allergies   Patient has no known allergies.   Review of Systems Review of Systems  Per HPI  Physical Exam Triage Vital Signs ED Triage Vitals  Encounter Vitals Group     BP 01/19/24 1149 103/69     Girls Systolic BP Percentile --      Girls Diastolic BP Percentile --      Boys Systolic BP Percentile --      Boys Diastolic BP Percentile --      Pulse Rate 01/19/24 1149 72     Resp 01/19/24 1149 16     Temp 01/19/24 1149 98.2 F (36.8 C)     Temp Source 01/19/24 1149 Oral     SpO2 01/19/24 1149 97 %     Weight --      Height --      Head Circumference --      Peak Flow --      Pain Score 01/19/24 1148 0     Pain Loc --      Pain Education --      Exclude from Growth Chart --    No data found.  Updated Vital Signs BP 103/69 (BP Location: Left Arm)   Pulse 72   Temp 98.2 F (36.8 C) (Oral)   Resp 16   LMP 11/29/2023 (Exact Date)   SpO2 97%   Visual Acuity Right Eye Distance:   Left Eye Distance:   Bilateral Distance:    Right Eye Near:   Left Eye Near:    Bilateral Near:     Physical Exam Vitals and nursing note reviewed.  Constitutional:      General: She is awake. She is not in acute distress.    Appearance: Normal appearance. She is well-developed and well-groomed. She is not ill-appearing.  HENT:     Mouth/Throat:     Pharynx: Posterior oropharyngeal erythema and uvula swelling present.     Comments: Mild erythema noted to posterior oropharynx with mild erythema and swelling Skin:    General: Skin is warm and dry.  Neurological:     General: No focal deficit present.     Mental Status: She is alert and oriented to person, place, and time. Mental status is at baseline.  Psychiatric:         Behavior: Behavior is cooperative.      UC Treatments / Results  Labs (all labs ordered are listed, but only abnormal results are displayed) Labs Reviewed  POCT URINE PREGNANCY - Normal    EKG   Radiology No results found.  Procedures Procedures (including critical care time)  Medications Ordered in UC Medications  cefTRIAXone (ROCEPHIN) injection 500 mg (has no administration in time range)    Initial Impression / Assessment and Plan / UC Course  I have reviewed the triage vital signs and the nursing notes.  Pertinent labs & imaging results that were available during my care of the patient were reviewed by me and considered in my medical decision making (see chart for details).     Patient is overall well-appearing.  Vitals are stable.  UPT negative.  Given IM Rocephin in clinic for treatment of gonorrhea of the pharynx.  Discussed follow-up and return precautions. Final Clinical Impressions(s) / UC Diagnoses   Final diagnoses:  Gonorrhea of pharynx     Discharge Instructions      You received an injection of Rocephin in clinic today to treat gonorrhea. It is recommended to refrain from sexual intercourse for at least 7 days after receiving this treatment. Follow-up with your primary care provider or return here as needed.    ED Prescriptions   None    PDMP not reviewed this encounter.   Johnie Flaming A, NP 01/19/24 1218

## 2024-01-19 NOTE — ED Triage Notes (Signed)
 Pt seen at ED on 10/21 for sore throat and trouble swallowing. Pt was positive for gonorrhea and needing treatment.

## 2024-03-28 ENCOUNTER — Other Ambulatory Visit: Payer: Self-pay

## 2024-03-28 DIAGNOSIS — Z79899 Other long term (current) drug therapy: Secondary | ICD-10-CM | POA: Insufficient documentation

## 2024-03-28 DIAGNOSIS — D509 Iron deficiency anemia, unspecified: Secondary | ICD-10-CM | POA: Diagnosis not present

## 2024-03-28 DIAGNOSIS — N939 Abnormal uterine and vaginal bleeding, unspecified: Secondary | ICD-10-CM | POA: Diagnosis present

## 2024-03-28 DIAGNOSIS — R55 Syncope and collapse: Secondary | ICD-10-CM | POA: Diagnosis not present

## 2024-03-28 NOTE — ED Triage Notes (Signed)
 Patient c/o vaginal bleeding since last Friday.  Patient reports it was light and then got heavy.  Patient reports bleeding through overnight pads.  Patient reports she continues to bleed heavily.

## 2024-03-29 ENCOUNTER — Encounter (HOSPITAL_BASED_OUTPATIENT_CLINIC_OR_DEPARTMENT_OTHER): Payer: Self-pay | Admitting: Emergency Medicine

## 2024-03-29 ENCOUNTER — Emergency Department (HOSPITAL_BASED_OUTPATIENT_CLINIC_OR_DEPARTMENT_OTHER)
Admission: EM | Admit: 2024-03-29 | Discharge: 2024-03-29 | Disposition: A | Discharge status: Home / Self Care | Attending: Emergency Medicine | Admitting: Emergency Medicine

## 2024-03-29 DIAGNOSIS — N938 Other specified abnormal uterine and vaginal bleeding: Secondary | ICD-10-CM

## 2024-03-29 DIAGNOSIS — D509 Iron deficiency anemia, unspecified: Secondary | ICD-10-CM

## 2024-03-29 LAB — CBC WITH DIFFERENTIAL/PLATELET
Abs Immature Granulocytes: 0.02 K/uL (ref 0.00–0.07)
Basophils Absolute: 0 K/uL (ref 0.0–0.1)
Basophils Relative: 0 %
Eosinophils Absolute: 0.2 K/uL (ref 0.0–0.5)
Eosinophils Relative: 2 %
HCT: 34.3 % — ABNORMAL LOW (ref 36.0–46.0)
Hemoglobin: 10.8 g/dL — ABNORMAL LOW (ref 12.0–15.0)
Immature Granulocytes: 0 %
Lymphocytes Relative: 35 %
Lymphs Abs: 3.7 K/uL (ref 0.7–4.0)
MCH: 26.3 pg (ref 26.0–34.0)
MCHC: 31.5 g/dL (ref 30.0–36.0)
MCV: 83.5 fL (ref 80.0–100.0)
Monocytes Absolute: 0.7 K/uL (ref 0.1–1.0)
Monocytes Relative: 7 %
Neutro Abs: 6 K/uL (ref 1.7–7.7)
Neutrophils Relative %: 56 %
Platelets: 253 K/uL (ref 150–400)
RBC: 4.11 MIL/uL (ref 3.87–5.11)
RDW: 13.8 % (ref 11.5–15.5)
WBC: 10.7 K/uL — ABNORMAL HIGH (ref 4.0–10.5)
nRBC: 0 % (ref 0.0–0.2)

## 2024-03-29 LAB — URINALYSIS, ROUTINE W REFLEX MICROSCOPIC

## 2024-03-29 LAB — URINALYSIS, MICROSCOPIC (REFLEX): RBC / HPF: >50 RBC/hpf (ref 0–5)

## 2024-03-29 LAB — WET PREP, GENITAL
Clue Cells Wet Prep HPF POC: NONE SEEN
Sperm: NONE SEEN
Trich, Wet Prep: NONE SEEN
WBC, Wet Prep HPF POC: <10
Yeast Wet Prep HPF POC: NONE SEEN

## 2024-03-29 LAB — PREGNANCY, URINE: Preg Test, Ur: NEGATIVE

## 2024-03-29 MED ORDER — MEDROXYPROGESTERONE ACETATE 10 MG PO TABS: 10.0000 mg | ORAL_TABLET | Freq: Every day | ORAL | 0 refills | Status: DC

## 2024-03-29 MED ORDER — FERROUS SULFATE 325 (65 FE) MG PO TABS: 325.0000 mg | ORAL_TABLET | Freq: Every day | ORAL | 0 refills | Status: AC

## 2024-03-29 NOTE — ED Provider Notes (Signed)
 " [place] EMERGENCY DEPARTMENT AT [facility] Provider Note   CSN: [id number] Arrival date & time: 03/28/24  2351     Patient presents with: Vaginal Bleeding   Amber Sheppard is a 34 y.o. female.   HPI     This is a 34 year old female who presents with concern for abnormal uterine bleeding.  Patient reports that she has not had a menstrual cycle since September.  She has bilateral salpingectomy.  She states that last Wednesday she began to have some spotting.  Since last Friday she has had passage of clots.  She reports bleeding through multiple pads.  She also reports near syncope.  She states that she has felt dizzy and lightheaded.  She has not actually passed out.  Does report having to take iron during her prior pregnancy.  Denies any significant abdominal pain.  Prior to Admission medications  Medication Sig Start Date End Date Taking? Authorizing Provider  ferrous sulfate  325 (65 FE) MG tablet Take 1 tablet (325 mg total) by mouth daily. 03/29/24  Yes Agam Davenport, Charmaine FALCON, MD  medroxyPROGESTERone  (PROVERA ) 10 MG tablet Take 1 tablet (10 mg total) by mouth daily. 03/29/24  Yes Laurielle Selmon, Charmaine FALCON, MD  clindamycin  (CLEOCIN ) 150 MG capsule Take 1 capsule (150 mg total) by mouth every 6 (six) hours. 09/01/22   Geroldine Berg, MD  diclofenac  (VOLTAREN ) 75 MG EC tablet Take 1 tablet (75 mg total) by mouth 2 (two) times daily. 07/25/22   Sofia, Leslie K, PA-C  HYDROcodone -acetaminophen  (NORCO) 5-325 MG tablet Take 1-2 tablets by mouth every 6 (six) hours as needed. 09/01/22   Geroldine Berg, MD  lidocaine  (XYLOCAINE ) 2 % solution Use as directed 15 mLs in the mouth or throat 4 (four) times daily as needed for mouth pain. 01/15/24   Mesner, Selinda, MD  methocarbamol  (ROBAXIN ) 500 MG tablet Take 1 tablet (500 mg total) by mouth 4 (four) times daily. 07/25/22   Sofia, Leslie K, PA-C  metroNIDAZOLE  (FLAGYL ) 500 MG tablet Take 1 tablet (500 mg total) by mouth 2 (two) times daily. Patient not taking:  Reported on 07/25/2022 07/24/22   Blaise Aleene KIDD, MD    Allergies: Patient has no known allergies.    Review of Systems  Constitutional:  Negative for fever.  Gastrointestinal:  Negative for abdominal pain.  Genitourinary:  Positive for vaginal bleeding. Negative for vaginal pain.  All other systems reviewed and are negative.   Updated Vital Signs BP (!) 149/91   Pulse 78   Temp 98 F (36.7 C)   Resp 18   Wt 132.5 kg   SpO2 100%   BMI 53.41 kg/m   Physical Exam Vitals and nursing note reviewed. Exam conducted with a chaperone present.  Constitutional:      Appearance: She is well-developed. She is obese. She is not ill-appearing.  HENT:     Head: Normocephalic and atraumatic.  Eyes:     Pupils: Pupils are equal, round, and reactive to light.  Cardiovascular:     Rate and Rhythm: Normal rate and regular rhythm.     Heart sounds: Normal heart sounds.  Pulmonary:     Effort: Pulmonary effort is normal. No respiratory distress.     Breath sounds: No wheezing.  Abdominal:     Palpations: Abdomen is soft.     Tenderness: There is no abdominal tenderness.  Genitourinary:    Comments: Normal external vaginal exam, blood pooled in the vaginal vault with clot present, no cervical friability or cervical motion  tenderness noted Musculoskeletal:     Cervical back: Neck supple.  Skin:    General: Skin is warm and dry.  Neurological:     Mental Status: She is alert and oriented to person, place, and time.  Psychiatric:        Mood and Affect: Mood normal.     (all labs ordered are listed, but only abnormal results are displayed) Labs Reviewed  CBC WITH DIFFERENTIAL/PLATELET - Abnormal; Notable for the following components:      Result Value   WBC 10.7 (*)    Hemoglobin 10.8 (*)    HCT 34.3 (*)    All other components within normal limits  URINALYSIS, ROUTINE W REFLEX MICROSCOPIC - Abnormal; Notable for the following components:   Color, Urine AMBER (*)    APPearance  CLOUDY (*)    Glucose, UA   (*)    Value: TEST NOT REPORTED DUE TO COLOR INTERFERENCE OF URINE PIGMENT   Hgb urine dipstick   (*)    Value: TEST NOT REPORTED DUE TO COLOR INTERFERENCE OF URINE PIGMENT   Bilirubin Urine   (*)    Value: TEST NOT REPORTED DUE TO COLOR INTERFERENCE OF URINE PIGMENT   Ketones, ur   (*)    Value: TEST NOT REPORTED DUE TO COLOR INTERFERENCE OF URINE PIGMENT   Protein, ur   (*)    Value: TEST NOT REPORTED DUE TO COLOR INTERFERENCE OF URINE PIGMENT   Nitrite   (*)    Value: TEST NOT REPORTED DUE TO COLOR INTERFERENCE OF URINE PIGMENT   Leukocytes,Ua   (*)    Value: TEST NOT REPORTED DUE TO COLOR INTERFERENCE OF URINE PIGMENT   All other components within normal limits  URINALYSIS, MICROSCOPIC (REFLEX) - Abnormal; Notable for the following components:   Bacteria, UA RARE (*)    All other components within normal limits  PREGNANCY, URINE    EKG: None  Radiology: No results found.   Procedures   Medications Ordered in the ED - No data to display                                  Medical Decision Making Amount and/or Complexity of Data Reviewed Labs: ordered.  Risk OTC drugs. Prescription drug management.   This patient presents to the ED for concern of abnormal uterine bleeding, this involves an extensive number of treatment options, and is a complaint that carries with it a high risk of complications and morbidity.  I considered the following differential and admission for this acute, potentially life threatening condition.  The differential diagnosis includes dysfunctional bleeding, menorrhagia, miscarriage  MDM:    This is a 34-year-old female who presents with uterine bleeding.  Reports significant bleeding at home.  She is nontoxic and vital signs are reassuring.  Reports near syncopal symptoms but she is not tachycardic or hypotensive.  Orthostatics are negative.  She does have fresh blood filled in the vaginal vault with clot present.  No  abdominal tenderness.  Hemoglobin is 10.8.  No prior for comparison.  We discussed options.  Patient would like to trial meds to stabilize her uterine lining.  Will give a trial of medroxyprogesterone  x 10 days.  Will also start her on iron.  She needs to follow-up with her OB/GYN.  Patient stated understanding.  (Labs, imaging, consults)  Labs: I Ordered, and personally interpreted labs.  The pertinent results include: CBC, urinalysis,  urine pregnancy  Imaging Studies ordered: I ordered imaging studies including none none I independently visualized and interpreted imaging. I agree with the radiologist interpretation  Additional history obtained from chart review.  External records from outside source obtained and reviewed including prior evaluations  Cardiac Monitoring: The patient was not maintained on a cardiac monitor.  If on the cardiac monitor, I personally viewed and interpreted the cardiac monitored which showed an underlying rhythm of: N/A  Reevaluation: After the interventions noted above, I reevaluated the patient and found that they have :stayed the same  Social Determinants of Health:  lives independently  Disposition: Discharge  Co morbidities that complicate the patient evaluation History reviewed. No pertinent past medical history.   Medicines Meds ordered this encounter  Medications   medroxyPROGESTERone  (PROVERA ) 10 MG tablet    Sig: Take 1 tablet (10 mg total) by mouth daily.    Dispense:  10 tablet    Refill:  0   ferrous sulfate  325 (65 FE) MG tablet    Sig: Take 1 tablet (325 mg total) by mouth daily.    Dispense:  30 tablet    Refill:  0    I have reviewed the patients home medicines and have made adjustments as needed  Problem List / ED Course: Problem List Items Addressed This Visit   None Visit Diagnoses       Dysfunctional uterine bleeding    -  Primary     Iron deficiency anemia, unspecified iron deficiency anemia type       Relevant  Medications   ferrous sulfate  325 (65 FE) MG tablet                Final diagnoses:  Dysfunctional uterine bleeding  Iron deficiency anemia, unspecified iron deficiency anemia type    ED Discharge Orders          Ordered    medroxyPROGESTERone  (PROVERA ) 10 MG tablet  Daily        03/29/24 0314    ferrous sulfate  325 (65 FE) MG tablet  Daily        03/29/24 0314               Bari Charmaine FALCON, MD 03/29/24 0320  "

## 2024-03-29 NOTE — Discharge Instructions (Addendum)
 You were seen today for abnormal uterine bleeding.  Start progesterone Provera  daily to support your uterine lining.  Follow-up closely with your OB/GYN.  You should also start iron.  Your hemoglobin was slightly low at 10.8.  This needs to be rechecked.  If you have ongoing bleeding, worsening bleeding, or you are bleeding through more than 1 pad per hour, you should be reevaluated.

## 2024-03-30 ENCOUNTER — Encounter (HOSPITAL_COMMUNITY): Payer: Self-pay | Admitting: Family Medicine

## 2024-03-30 ENCOUNTER — Inpatient Hospital Stay (HOSPITAL_COMMUNITY)
Admission: AD | Admit: 2024-03-30 | Discharge: 2024-03-31 | Disposition: A | Discharge status: Home / Self Care | Attending: Family Medicine | Admitting: Family Medicine

## 2024-03-30 DIAGNOSIS — Z9851 Tubal ligation status: Secondary | ICD-10-CM | POA: Insufficient documentation

## 2024-03-30 DIAGNOSIS — R42 Dizziness and giddiness: Secondary | ICD-10-CM | POA: Insufficient documentation

## 2024-03-30 DIAGNOSIS — N939 Abnormal uterine and vaginal bleeding, unspecified: Secondary | ICD-10-CM | POA: Diagnosis not present

## 2024-03-30 DIAGNOSIS — R109 Unspecified abdominal pain: Secondary | ICD-10-CM | POA: Insufficient documentation

## 2024-03-30 NOTE — MAU Provider Note (Incomplete)
" °  History     CSN: [id number]  Arrival date and time: 03/30/24 2236   Event Date/Time   First Provider Initiated Contact with Patient 03/30/24 2346      Chief Complaint  Patient presents with   Abdominal Pain   Vaginal Bleeding   HPI  {GYN/OB [id number]}  History reviewed. No pertinent past medical history.  History reviewed. No pertinent surgical history.  History reviewed. No pertinent family history.  Social History[1]  Allergies: Allergies[2]  Medications Prior to Admission  Medication Sig Dispense Refill Last Dose/Taking   ferrous sulfate  325 (65 FE) MG tablet Take 1 tablet (325 mg total) by mouth daily. 30 tablet 0 03/30/2024   ibuprofen (ADVIL) 800 MG tablet Take 800 mg by mouth every 8 (eight) hours as needed for cramping.   03/30/2024 at 10:00 AM   medroxyPROGESTERone  (PROVERA ) 10 MG tablet Take 1 tablet (10 mg total) by mouth daily. 10 tablet 0 03/29/2024   naproxen sodium (ALEVE) 220 MG tablet Take 220 mg by mouth.   03/29/2024 at  5:00 PM   clindamycin  (CLEOCIN ) 150 MG capsule Take 1 capsule (150 mg total) by mouth every 6 (six) hours. 28 capsule 0    diclofenac  (VOLTAREN ) 75 MG EC tablet Take 1 tablet (75 mg total) by mouth 2 (two) times daily. 20 tablet 0    HYDROcodone -acetaminophen  (NORCO) 5-325 MG tablet Take 1-2 tablets by mouth every 6 (six) hours as needed. 12 tablet 0    lidocaine  (XYLOCAINE ) 2 % solution Use as directed 15 mLs in the mouth or throat 4 (four) times daily as needed for mouth pain. 200 mL 0    methocarbamol  (ROBAXIN ) 500 MG tablet Take 1 tablet (500 mg total) by mouth 4 (four) times daily. 20 tablet 0    metroNIDAZOLE  (FLAGYL ) 500 MG tablet Take 1 tablet (500 mg total) by mouth 2 (two) times daily. (Patient not taking: Reported on 07/25/2022) 14 tablet 0     Review of Systems Physical Exam   Blood pressure (!) 134/58, pulse 88, temperature 99.1 F (37.3 C), temperature source Oral, resp. rate 18, height 5' 2 (1.575 m), weight (!)  137.5 kg, SpO2 100%.  Physical Exam  MAU Course  Procedures  MDM CBC  No results found for this or any previous visit (from the past 24 hours).   Assessment and Plan  ***  Ala Cart, CNM 03/30/2024, 11:46 PM       [1] Social History Tobacco Use   Smoking status: Never   Smokeless tobacco: Never  Vaping Use   Vaping status: Never Used  Substance Use Topics   Alcohol use: Not Currently   Drug use: Not Currently  [2] No Known Allergies "

## 2024-03-30 NOTE — MAU Provider Note (Addendum)
 " History     CSN: [id number]  Arrival date and time: 03/30/24 2236   Event Date/Time   First Provider Initiated Contact with Patient 03/30/24 2346      Chief Complaint  Patient presents with   Abdominal Pain   Vaginal Bleeding   HPI Ms. Amber Sheppard is a 34 y.o. year old [id number] non-pregnant female at who presents to MAU reporting heavy vaginal bleeding since 03/19/2024. She reports she has been soaking through overnight pads every 1-2 hours. She reports she passes golf-ball sized blood clots every time she goes to the bathroom . She reports abdominal cramping since 03/28/2024. She has taken Aleve yesterday and Ibuprofen 800 mg at 1000 this morning; without any relief. She was prescribed Provera  10 mg po daily from a provider she saw yesterday. She has only taken one of those today. She reports some dizziness and weakness. She had a BTL in 2014. She is concerned for an ectopic pregnancy. She moved here from [place], [place] 3 years ago, but has not gotten a GYN provider.  OB History   No obstetric history on file.     History reviewed. No pertinent past medical history.  History reviewed. No pertinent surgical history.  History reviewed. No pertinent family history.  Social History[1]  Allergies: Allergies[2]  Medications Prior to Admission  Medication Sig Dispense Refill Last Dose/Taking   ferrous sulfate  325 (65 FE) MG tablet Take 1 tablet (325 mg total) by mouth daily. 30 tablet 0 03/30/2024   ibuprofen (ADVIL) 800 MG tablet Take 800 mg by mouth every 8 (eight) hours as needed for cramping.   03/30/2024 at 10:00 AM   medroxyPROGESTERone  (PROVERA ) 10 MG tablet Take 1 tablet (10 mg total) by mouth daily. 10 tablet 0 03/29/2024   naproxen sodium (ALEVE) 220 MG tablet Take 220 mg by mouth.   03/29/2024 at  5:00 PM   clindamycin  (CLEOCIN ) 150 MG capsule Take 1 capsule (150 mg total) by mouth every 6 (six) hours. 28 capsule 0    diclofenac  (VOLTAREN ) 75 MG EC tablet Take 1 tablet (75 mg  total) by mouth 2 (two) times daily. 20 tablet 0    HYDROcodone -acetaminophen  (NORCO) 5-325 MG tablet Take 1-2 tablets by mouth every 6 (six) hours as needed. 12 tablet 0    lidocaine  (XYLOCAINE ) 2 % solution Use as directed 15 mLs in the mouth or throat 4 (four) times daily as needed for mouth pain. 200 mL 0    methocarbamol  (ROBAXIN ) 500 MG tablet Take 1 tablet (500 mg total) by mouth 4 (four) times daily. 20 tablet 0    metroNIDAZOLE  (FLAGYL ) 500 MG tablet Take 1 tablet (500 mg total) by mouth 2 (two) times daily. (Patient not taking: Reported on 07/25/2022) 14 tablet 0     Review of Systems  Constitutional: Negative.   HENT: Negative.    Eyes: Negative.   Respiratory: Negative.    Cardiovascular: Negative.   Gastrointestinal: Negative.   Endocrine: Negative.   Genitourinary:  Positive for pelvic pain (cramping) and vaginal bleeding.  Musculoskeletal: Negative.   Skin: Negative.   Allergic/Immunologic: Negative.   Neurological: Negative.   Hematological: Negative.   Psychiatric/Behavioral: Negative.     Physical Exam   Blood pressure (!) 134/58, pulse 88, temperature 99.1 F (37.3 C), temperature source Oral, resp. rate 18, height 5' 2 (1.575 m), weight (!) 137.5 kg, SpO2 100%.  Physical Exam Vitals and nursing note reviewed. Exam conducted with a chaperone present Bethann Ill, NT/NS).  Constitutional:  Appearance: Normal appearance. She is obese.  Cardiovascular:     Rate and Rhythm: Normal rate.  Pulmonary:     Effort: Pulmonary effort is normal.  Abdominal:     Palpations: Abdomen is soft.  Genitourinary:    General: Normal vulva.     Comments: Pelvic exam: External genitalia normal, SE: vaginal walls pink and well rugated, cervix is smooth, pink, no lesions, moderate amt of bright red blood with 1 medium sized blood clot in vaginal vault - removed using several large cotton-tipped swabs, cervix visually dilated ~1 cm. Bimanual exam deferred.  Musculoskeletal:         General: Normal range of motion.  Skin:    General: Skin is warm and dry.  Neurological:     Mental Status: She is alert and oriented to person, place, and time.  Psychiatric:        Mood and Affect: Mood normal.        Behavior: Behavior normal.        Thought Content: Thought content normal.        Judgment: Judgment normal.    MAU Course  Procedures  MDM Pelvic Exam  Assessment and Plan  1. Abnormal uterine bleeding (AUB) (Primary) - Information provided on AUB - Advised to stop taking Provera  as prescribes yesterday - Prescription for: Megace  120 mg po x 5 days, then 80 mg po x 5 days and 40 mg po daily until complete Disp #45 with 1 refill  - prescription for: Tramadol  50 mg every 6 hrs prn acute pain - Advised to continue taking Ibuprofen  - Discharge home - Scheduled F/U appt with Dr. Jeralyn - Patient verbalized an understanding of the plan of care and agrees.  Ala Cart, CNM 03/30/2024, 11:46 PM      [1]  Social History Tobacco Use   Smoking status: Never   Smokeless tobacco: Never  Vaping Use   Vaping status: Never Used  Substance Use Topics   Alcohol use: Not Currently   Drug use: Not Currently  [2] No Known Allergies  "

## 2024-03-30 NOTE — MAU Note (Addendum)
..  Amber Sheppard is a 34 y.o. at Unknown here in MAU reporting: vaginal bleeding since 12/24 and bleeding has gotten worse. Soaking through overnight pad every 1-2 hours, reports every time she goes to the bathroom she has golf-ball sized blood clots. Has intermittent dizziness and weakness.  Reports she has had a tubal ligation in 2014 and is concerned she might have an ectopic.  Abdominal cramping that began 1/2 and is mostly on her right side. Took 800mg  ibuprofen at 1000 and aleve yesterday. Neither has helped with pain.  Prior to this bleeding she has not had a period since September.  Pain score: 8/10 Vitals:   03/30/24 2252 03/30/24 2253  BP:  (!) 134/58  Pulse:  88  Resp:  18  Temp:  99.1 F (37.3 C)  SpO2: 100%      FHT:n/a Lab orders placed from triage: none

## 2024-03-31 ENCOUNTER — Telehealth: Payer: Self-pay | Admitting: Family Medicine

## 2024-03-31 DIAGNOSIS — R42 Dizziness and giddiness: Secondary | ICD-10-CM | POA: Diagnosis present

## 2024-03-31 DIAGNOSIS — R109 Unspecified abdominal pain: Secondary | ICD-10-CM | POA: Diagnosis present

## 2024-03-31 DIAGNOSIS — N939 Abnormal uterine and vaginal bleeding, unspecified: Secondary | ICD-10-CM | POA: Diagnosis present

## 2024-03-31 DIAGNOSIS — Z9851 Tubal ligation status: Secondary | ICD-10-CM | POA: Diagnosis not present

## 2024-03-31 LAB — GC/CHLAMYDIA PROBE AMP (~~LOC~~) NOT AT ARMC
Chlamydia: NEGATIVE
Comment: NEGATIVE
Comment: NORMAL
Neisseria Gonorrhea: NEGATIVE

## 2024-03-31 MED ORDER — TRAMADOL HCL 50 MG PO TABS: 50.0000 mg | ORAL_TABLET | Freq: Four times a day (QID) | ORAL | 0 refills | Status: AC | PRN

## 2024-03-31 MED ORDER — MEGESTROL ACETATE 40 MG PO TABS: ORAL_TABLET | ORAL | 1 refills | Status: AC

## 2024-03-31 NOTE — Telephone Encounter (Signed)
 Received a call from [place], the mother of the patient Amber Sheppard. Glade reported that her daughter was seen in the MAU and was told by Dr. Letha that she would be guaranteed an appointment today. I explained that we did not have anything available today. She requested to speak with a production designer, theatre/television/film. I informed her that I would forward her concerns to our clinical staff and explained that [name] was scheduled for the next available appointment. Glade insisted that her daughter be seen sooner due to significant bleeding. After consulting with Corinn Hurst, Office Manager, I was advised to send the request to the clinical staff for evaluation. Since we have multiple locations, there may be an opportunity for [name] to be seen sooner at another office

## 2024-04-03 NOTE — Telephone Encounter (Signed)
 I called pt and discussed her status. She stated that she has been taking medication as prescribed and her bleeding has greatly reduced. Pt is currently scheduled to be seen in office on 2/17. I offered her a sooner appointment on 1/12 @ 0815 and she accepted. She was advised of our location and had no questions.

## 2024-04-07 ENCOUNTER — Ambulatory Visit (INDEPENDENT_AMBULATORY_CARE_PROVIDER_SITE_OTHER): Admitting: Obstetrics and Gynecology

## 2024-04-07 ENCOUNTER — Encounter: Payer: Self-pay | Admitting: Obstetrics and Gynecology

## 2024-04-07 ENCOUNTER — Other Ambulatory Visit (HOSPITAL_COMMUNITY)
Admission: RE | Admit: 2024-04-07 | Discharge: 2024-04-07 | Disposition: A | Source: Ambulatory Visit | Attending: Obstetrics and Gynecology | Admitting: Obstetrics and Gynecology

## 2024-04-07 ENCOUNTER — Other Ambulatory Visit: Payer: Self-pay

## 2024-04-07 VITALS — BP 109/74 | HR 78 | Wt 301.2 lb

## 2024-04-07 DIAGNOSIS — Z113 Encounter for screening for infections with a predominantly sexual mode of transmission: Secondary | ICD-10-CM

## 2024-04-07 DIAGNOSIS — Z124 Encounter for screening for malignant neoplasm of cervix: Secondary | ICD-10-CM | POA: Diagnosis present

## 2024-04-07 DIAGNOSIS — N939 Abnormal uterine and vaginal bleeding, unspecified: Secondary | ICD-10-CM | POA: Diagnosis not present

## 2024-04-07 NOTE — Progress Notes (Signed)
 "   GYNECOLOGY VISIT  Patient name: Amber Sheppard MRN [id number]  Date of birth: 12/15/1990 Chief Complaint:   Gynecologic Exam  History:  Harriet Edison for AUB. Seen in [facility] for bleeding. About a week prior she had sudden onset heavy bleeding. Filling a pad every 15-60 minutes with pain. Pain varies from cramping to contraction like pain. This is the first time she has had something like this occur. Last menses had been in September. Has not previously had an issue with missing cycles, even when diagnosed with PCOS in 2018. Since menarche her periods typically are 3 days long with moderate cramping and changing a pad every 5-6 hours.  Went to an [employer] in [place] - having symptoms of facial hair and hormones on lab work were abnormal. Has not typically had any missed periods. No problems with getting pregnant and the only time missed a cycle was during pregnancy. No prior use of any metformin or spironlactone for management of symptoms. Had been prescribed phentermine for weight loss, in 2020. No recent changes in medication. Has gained weight, about 11 pounds in 90 days but otherwise no changes. No changes in skin or hair texture. May have a little nipple discharge with pinching but otherwise no nipple discharge. No breast changes that she has noticed. No changes in bowel habits during this time either. Her period in September was a regular period. No new stressors that she can think of. Laid off in November but outside of that no other changes. No prior diagnosis of HTN and had gDM in all of pregnancies but no DM diagnosis outside of pregnancy. Last pap smear was completed in 2023 at [facility]. Initially had gone to an outside ED and was discharged; then presented to [facility] due to new onset dizziness and lightheadedness. Prescribed megace  taper which stopped bleeding on this past Saturday and currently taking 2 pills on the taper. Not sexually active since November. No dyspareunia when she last had  intercourse. She is ok with STI testing today. Had GC/CT done at [facility]U visit.  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  When working out, noticed doing well and body feeling well  All deliveries were vaginal deliveries with interval tubal completed. No anesthesia complications.    The following portions of the patient's history were reviewed and updated as appropriate: allergies, current medications, past family history, past medical history, past social history, past surgical history and problem list.   Health Maintenance:   Last pap No results found for: DIAGPAP, HPVHIGH, ADEQPAP  Health Maintenance  Topic Date Due   COVID-19 Vaccine (1) Never done   Hepatitis C Screening  Never done   DTaP/Tdap/Td vaccine (1 - Tdap) Never done   Hepatitis B Vaccine (1 of 3 - 19+ 3-dose series) Never done   HPV Vaccine (1 - Risk 3-dose SCDM series) Never done   Pap with HPV screening  Never done   Flu Shot  Never done   HIV Screening  Completed   Pneumococcal Vaccine  Aged Out   Meningitis B Vaccine  Aged Out    Review of Systems:  Pertinent items are noted in HPI. Comprehensive review of systems was otherwise negative.   Objective:  Physical Exam BP 109/74   Pulse 78   Wt (!) 301 lb 3.2 oz (136.6 kg)   LMP 03/16/2024   BMI 55.09 kg/m    Physical Exam Vitals and  nursing note reviewed. Exam conducted with a chaperone present.  Constitutional:      Appearance: Normal appearance.  HENT:     Head: Normocephalic and atraumatic.  Pulmonary:     Effort: Pulmonary effort is normal.     Breath sounds: Normal breath sounds.  Genitourinary:    General: Normal vulva.     Exam position: Lithotomy position.     Vagina: Normal.     Cervix: Normal.  Skin:    General: Skin is warm and dry.  Neurological:     General: No focal deficit present.     Mental Status: She is alert.   Psychiatric:        Mood and Affect: Mood normal.        Behavior: Behavior normal.        Thought Content: Thought content normal.        Judgment: Judgment normal.        Assessment & Plan:  1. Abnormal uterine bleeding (AUB) (Primary) Normal examination today. Labs and ultrasound ordered as part of evaluation. Recommend continuing with daily administration of megace  once single pill daily reached to help manage bleeding. Possible anovulation due to PCOS with subsequent prolonged bleeding.  Noted to have anemia on prior CBC, will recheck today and assess for need for iron transfusion vs continued oral administration.  - US  PELVIC COMPLETE WITH TRANSVAGINAL; Future - Hemoglobin A1c - Testosterone ,Free and Total - Prolactin - CBC - Follicle stimulating hormone  2. Screening for cervical cancer Pap collected today  - Cytology - PAP  3. Screening examination for STI Serum STI screening today  - RPR+HBsAg+HCVAb+...   Carter Quarry, MD [facility]imally In[facility], [facility] "

## 2024-04-09 ENCOUNTER — Ambulatory Visit: Payer: Self-pay | Admitting: Obstetrics and Gynecology

## 2024-04-09 LAB — HEMOGLOBIN A1C
Est. average glucose Bld gHb Est-mCnc: 128 mg/dL
Hgb A1c MFr Bld: 6.1 % — ABNORMAL HIGH (ref 4.8–5.6)

## 2024-04-09 LAB — CBC
Hematocrit: 32.3 % — ABNORMAL LOW (ref 34.0–46.6)
Hemoglobin: 10.2 g/dL — ABNORMAL LOW (ref 11.1–15.9)
MCH: 26.3 pg — ABNORMAL LOW (ref 26.6–33.0)
MCHC: 31.6 g/dL (ref 31.5–35.7)
MCV: 83 fL (ref 79–97)
Platelets: 274 x10E3/uL (ref 150–450)
RBC: 3.88 x10E6/uL (ref 3.77–5.28)
RDW: 13.5 % (ref 11.7–15.4)
WBC: 8.2 x10E3/uL (ref 3.4–10.8)

## 2024-04-09 LAB — TESTOSTERONE,FREE AND TOTAL
Testosterone, Free: 0.6 pg/mL (ref 0.0–4.2)
Testosterone: 16 ng/dL (ref 8–60)

## 2024-04-09 LAB — RPR+HBSAG+HCVAB+...
HIV Screen 4th Generation wRfx: NONREACTIVE
Hep C Virus Ab: NONREACTIVE
Hepatitis B Surface Ag: NEGATIVE
RPR Ser Ql: NONREACTIVE

## 2024-04-09 LAB — PROLACTIN: Prolactin: 12.2 ng/mL (ref 4.8–33.4)

## 2024-04-09 LAB — FOLLICLE STIMULATING HORMONE: FSH: 5.3 m[IU]/mL

## 2024-04-10 ENCOUNTER — Ambulatory Visit (HOSPITAL_COMMUNITY)
Admission: RE | Admit: 2024-04-10 | Discharge: 2024-04-10 | Disposition: A | Discharge status: Home / Self Care | Source: Ambulatory Visit | Attending: Obstetrics and Gynecology | Admitting: Obstetrics and Gynecology

## 2024-04-10 DIAGNOSIS — N939 Abnormal uterine and vaginal bleeding, unspecified: Secondary | ICD-10-CM | POA: Diagnosis present

## 2024-04-10 LAB — CYTOLOGY - PAP
Comment: NEGATIVE
Diagnosis: NEGATIVE
Diagnosis: REACTIVE
High risk HPV: NEGATIVE

## 2024-05-08 ENCOUNTER — Ambulatory Visit: Payer: Self-pay | Admitting: Obstetrics and Gynecology

## 2024-05-13 ENCOUNTER — Encounter: Payer: Self-pay | Admitting: Obstetrics and Gynecology
